# Patient Record
Sex: Female | Born: 1960 | Race: White | Hispanic: No
Health system: Southern US, Community
[De-identification: ages and names within clinical notes are randomized; demographics above are authoritative.]

---

## 1998-07-05 ENCOUNTER — Other Ambulatory Visit: Admission: RE | Admit: 1998-07-05 | Discharge: 1998-07-05 | Payer: Self-pay | Admitting: Obstetrics and Gynecology

## 2005-08-08 ENCOUNTER — Encounter: Payer: Self-pay | Admitting: Emergency Medicine

## 2005-08-08 ENCOUNTER — Inpatient Hospital Stay (HOSPITAL_COMMUNITY): Admission: AD | Admit: 2005-08-08 | Discharge: 2005-08-11 | Payer: Self-pay | Admitting: Internal Medicine

## 2005-08-08 ENCOUNTER — Ambulatory Visit: Payer: Self-pay | Admitting: Pulmonary Disease

## 2005-12-12 ENCOUNTER — Encounter: Admission: RE | Admit: 2005-12-12 | Discharge: 2005-12-12 | Payer: Self-pay | Admitting: Family Medicine

## 2005-12-15 ENCOUNTER — Encounter: Admission: RE | Admit: 2005-12-15 | Discharge: 2005-12-15 | Payer: Self-pay | Admitting: Family Medicine

## 2006-03-05 ENCOUNTER — Other Ambulatory Visit: Admission: RE | Admit: 2006-03-05 | Discharge: 2006-03-05 | Payer: Self-pay | Admitting: Obstetrics and Gynecology

## 2006-07-22 ENCOUNTER — Ambulatory Visit (HOSPITAL_COMMUNITY): Admission: RE | Admit: 2006-07-22 | Discharge: 2006-07-22 | Payer: Self-pay | Admitting: Urology

## 2006-09-29 ENCOUNTER — Other Ambulatory Visit: Payer: Self-pay

## 2006-09-29 ENCOUNTER — Inpatient Hospital Stay: Payer: Self-pay | Admitting: Unknown Physician Specialty

## 2007-06-18 IMAGING — CT CT PELVIS W/ CM
1 of 4 series · 11 of 32 positions shown, 17 images · IV contrast (omnipaque)
Comparison: None

CLINICAL DATA: 45-year-old, chronic UTIs.  
 ABDOMEN CT WITHOUT AND WITH CONTRAST:
TECHNIQUE: Multidetector CT imaging of the abdomen was performed both before and during bolus administration of intravenous contrast.
 Contrast:  125 cc Omnipaque 300
TECHNIQUE: Multidetector CT imaging of the pelvis was performed following the standard protocol during bolus administration of intravenous contrast.

[Series 3: abd_pel 5.0 b40f st · axial · 0.61mm/px · z∈[-400,-40]mm · 11 of 88 slices shown, 17 images]
[im 8/88  soft-tissue]
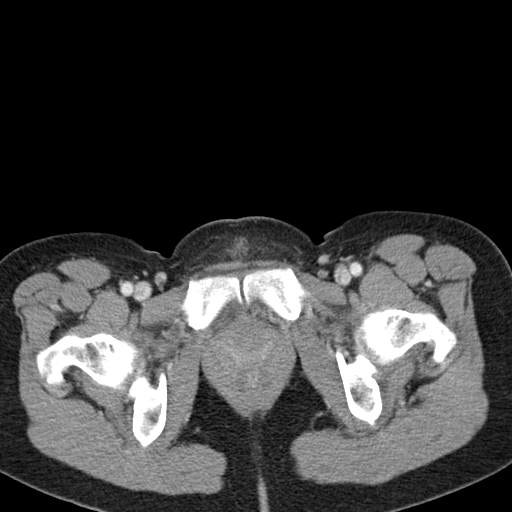
[im 8/88  bone]
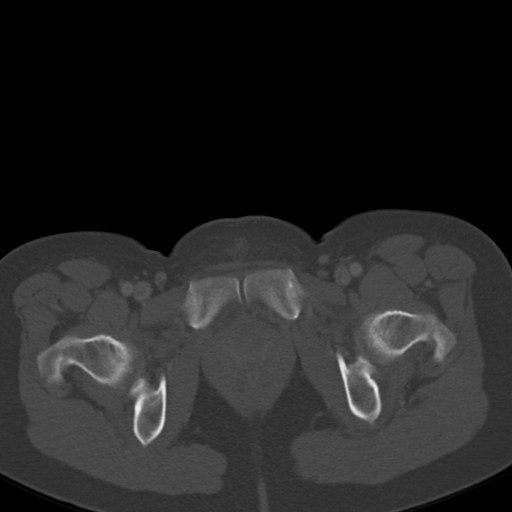
[im 15/88  soft-tissue]
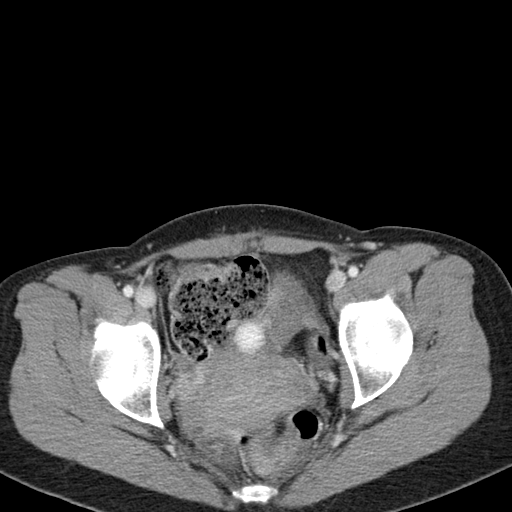
[im 22/88  soft-tissue]
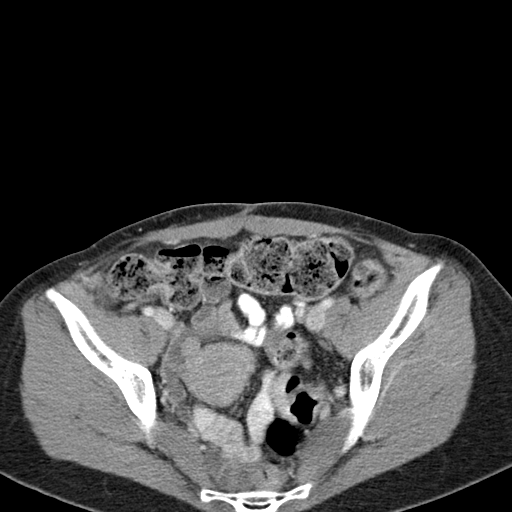
[im 30/88  soft-tissue]
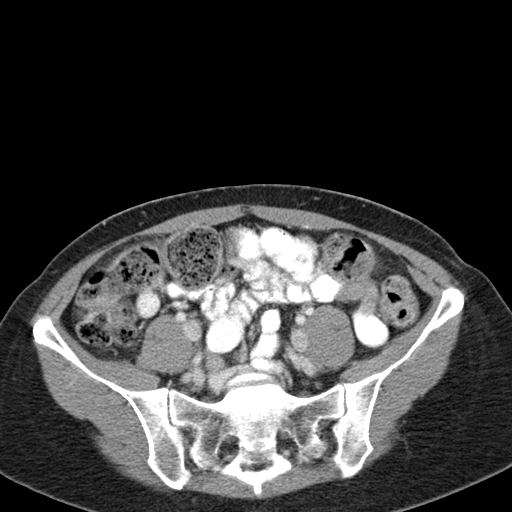
[im 37/88  soft-tissue]
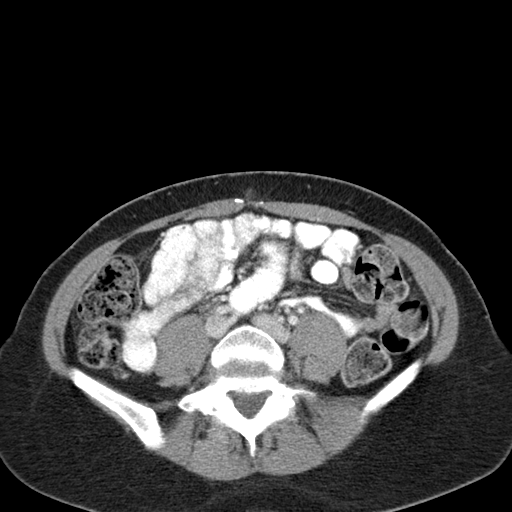
[im 44/88  soft-tissue]
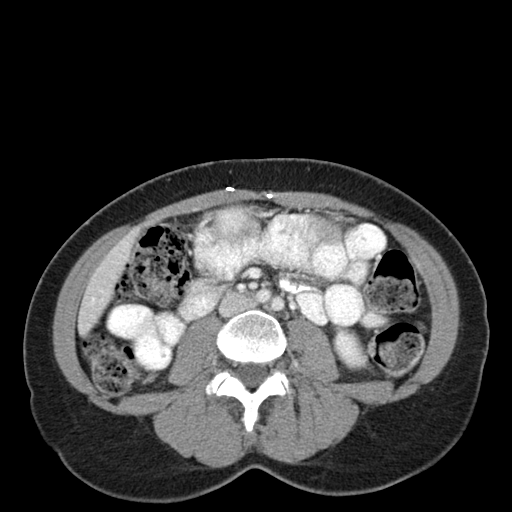
[im 51/88  soft-tissue]
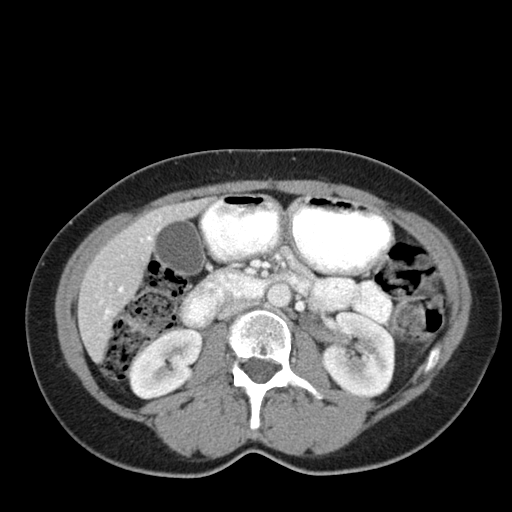
[im 59/88  soft-tissue]
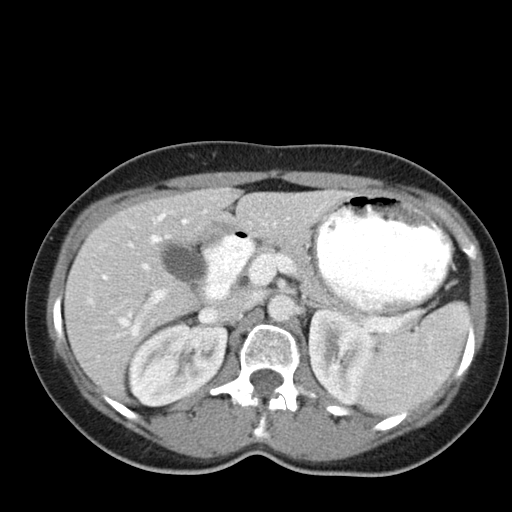
[im 59/88  lung]
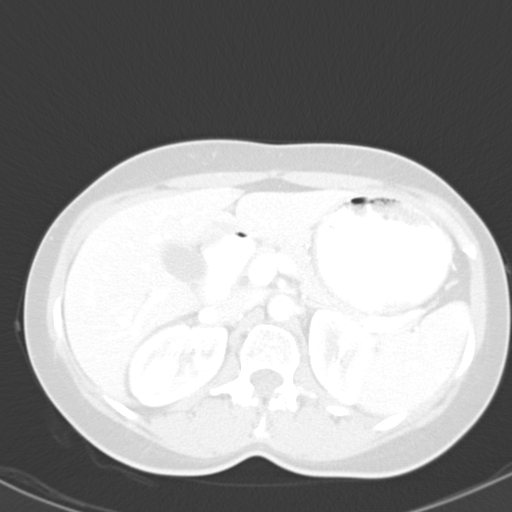
[im 66/88  soft-tissue]
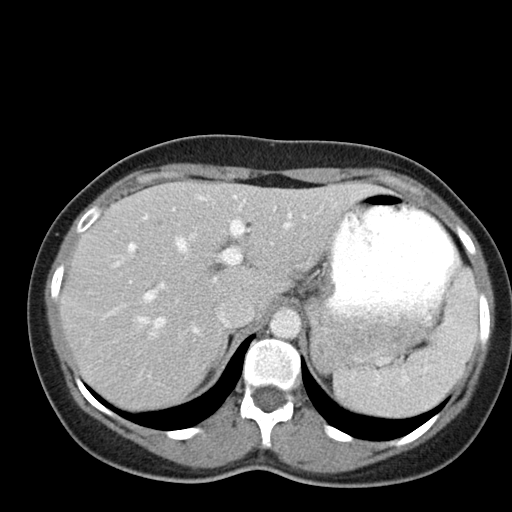
[im 66/88  lung]
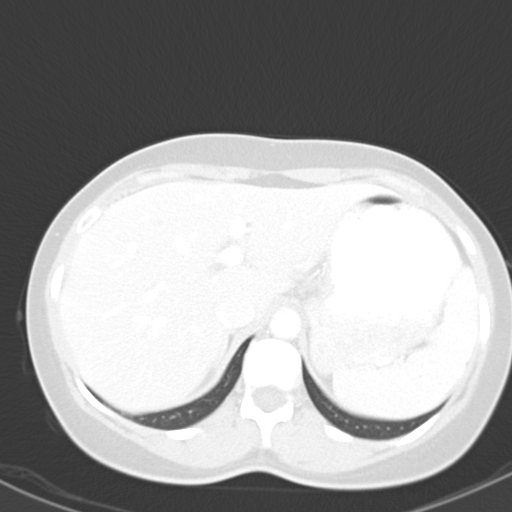
[im 66/88  bone]
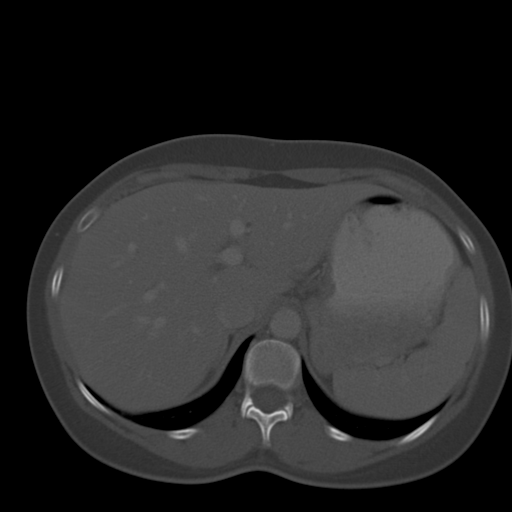
[im 73/88  soft-tissue]
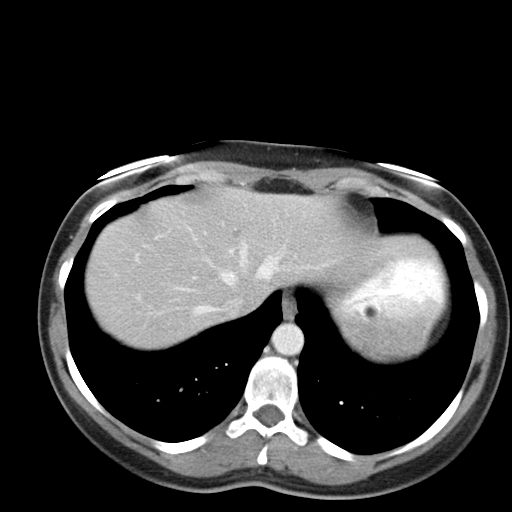
[im 73/88  lung]
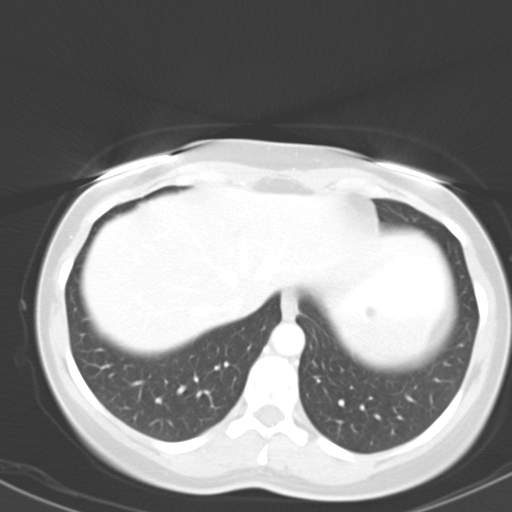
[im 80/88  soft-tissue]
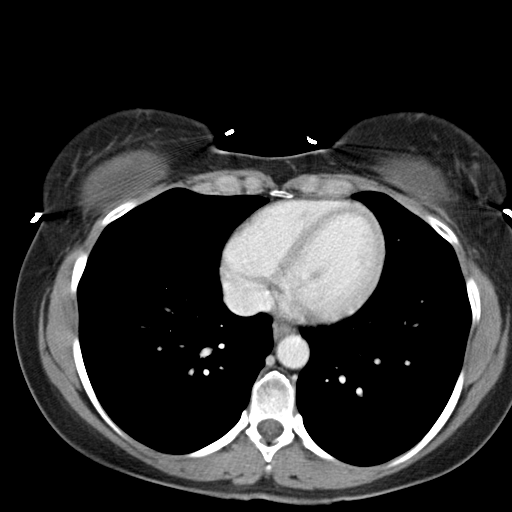
[im 80/88  lung]
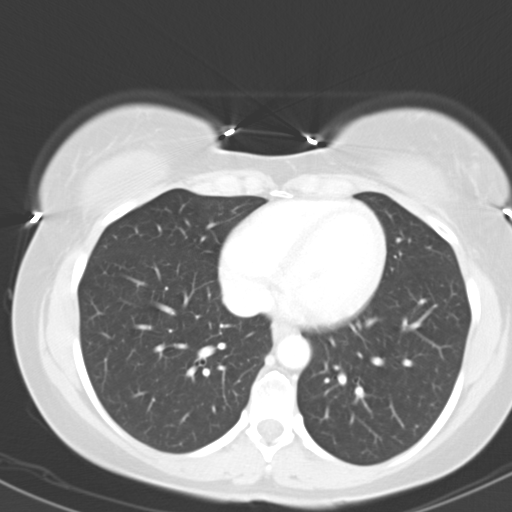

[11 of 32 positions shown; findings below may reference images not displayed]

FINDINGS: The lung bases are clear.  
 There are several low attenuation lesions in the liver which are likely benign hepatic cysts.  The ones that I can measure measure at water attenuation.  There is no enhancement.  There are also bilateral renal cysts.   The spleen is normal in size. The gallbladder appears normal.  The pancreas, adrenal glands, and kidneys demonstrate no significant findings.  The ureters are visualized down into the bladder and demonstrate no abnormalities either.  
 The stomach, duodenum, small bowel and colon are unremarkable.  Mild constipation with a moderate amount of stool throughout the colon.  The aorta is normal in caliber.  Major branch vessels are normal.  The portal and splenic veins are patent.   No significant bony findings.
IMPRESSION: 1.  Multiple small liver lesions consistent with benign hepatic cysts.  There are also benign-appearing bilateral renal cysts.  
 2.  No collecting system abnormalities are seen involving the kidneys.  No renal masses and the ureters appear normal.  
 3.  Mild constipation.
 4.  No mesenteric or retroperitoneal masses or adenopathy. 
 PELVIS CT WITH CONTRAST:
FINDINGS: The uterus and ovaries are unremarkable.  The bladder has a small amount of air in it.  I assume this is from recent instrumentation or catheterization.  Low-lying cecum in the pelvis.  Rectum and sigmoid colon are grossly normal.
IMPRESSION: 1.  Small amount of air is noted in the bladder.  This may be due to recent instrumentation or catheterization.  If there is no history of such, the findings could be secondary to cystitis or a colovesical fistula.  
 2.  Normal appearance of the ureters.

## 2010-03-06 ENCOUNTER — Emergency Department: Payer: Self-pay | Admitting: Emergency Medicine

## 2010-06-09 ENCOUNTER — Encounter: Payer: Self-pay | Admitting: Obstetrics and Gynecology

## 2013-06-06 ENCOUNTER — Telehealth (HOSPITAL_COMMUNITY): Payer: Self-pay | Admitting: *Deleted

## 2013-06-06 NOTE — Telephone Encounter (Signed)
[facility] called to inform Dr. Donell BeersPlovsky that patient is attempting a refill of Adderall 30mg  and if filled this would be 180 tablets since Dec 5th. Would like him to be aware and approve refill before it is filled. Explained to Tristen that the patient is not seen at this office. She shows that she saw Dr. Donell BeersPlovsky at [facility]. She states she will attempt to call that office for clarification.

## 2014-06-02 ENCOUNTER — Emergency Department: Payer: Self-pay | Admitting: Emergency Medicine

## 2015-04-29 IMAGING — CR DG CHEST 2V
1 series · 2 of 2 positions shown · non-contrast
Comparison: None.

CLINICAL DATA: Initial encounter for left chest pain worse with
deep inspiration. Tenderness at the lateral left third rib. Symptoms
are of 1 week duration.

EXAM:
CHEST  2 VIEW

[Series 1: dxr chest pa (or ap) and lateral · 0.14mm/px · 2 of 2 slices shown]
[im 1/2]
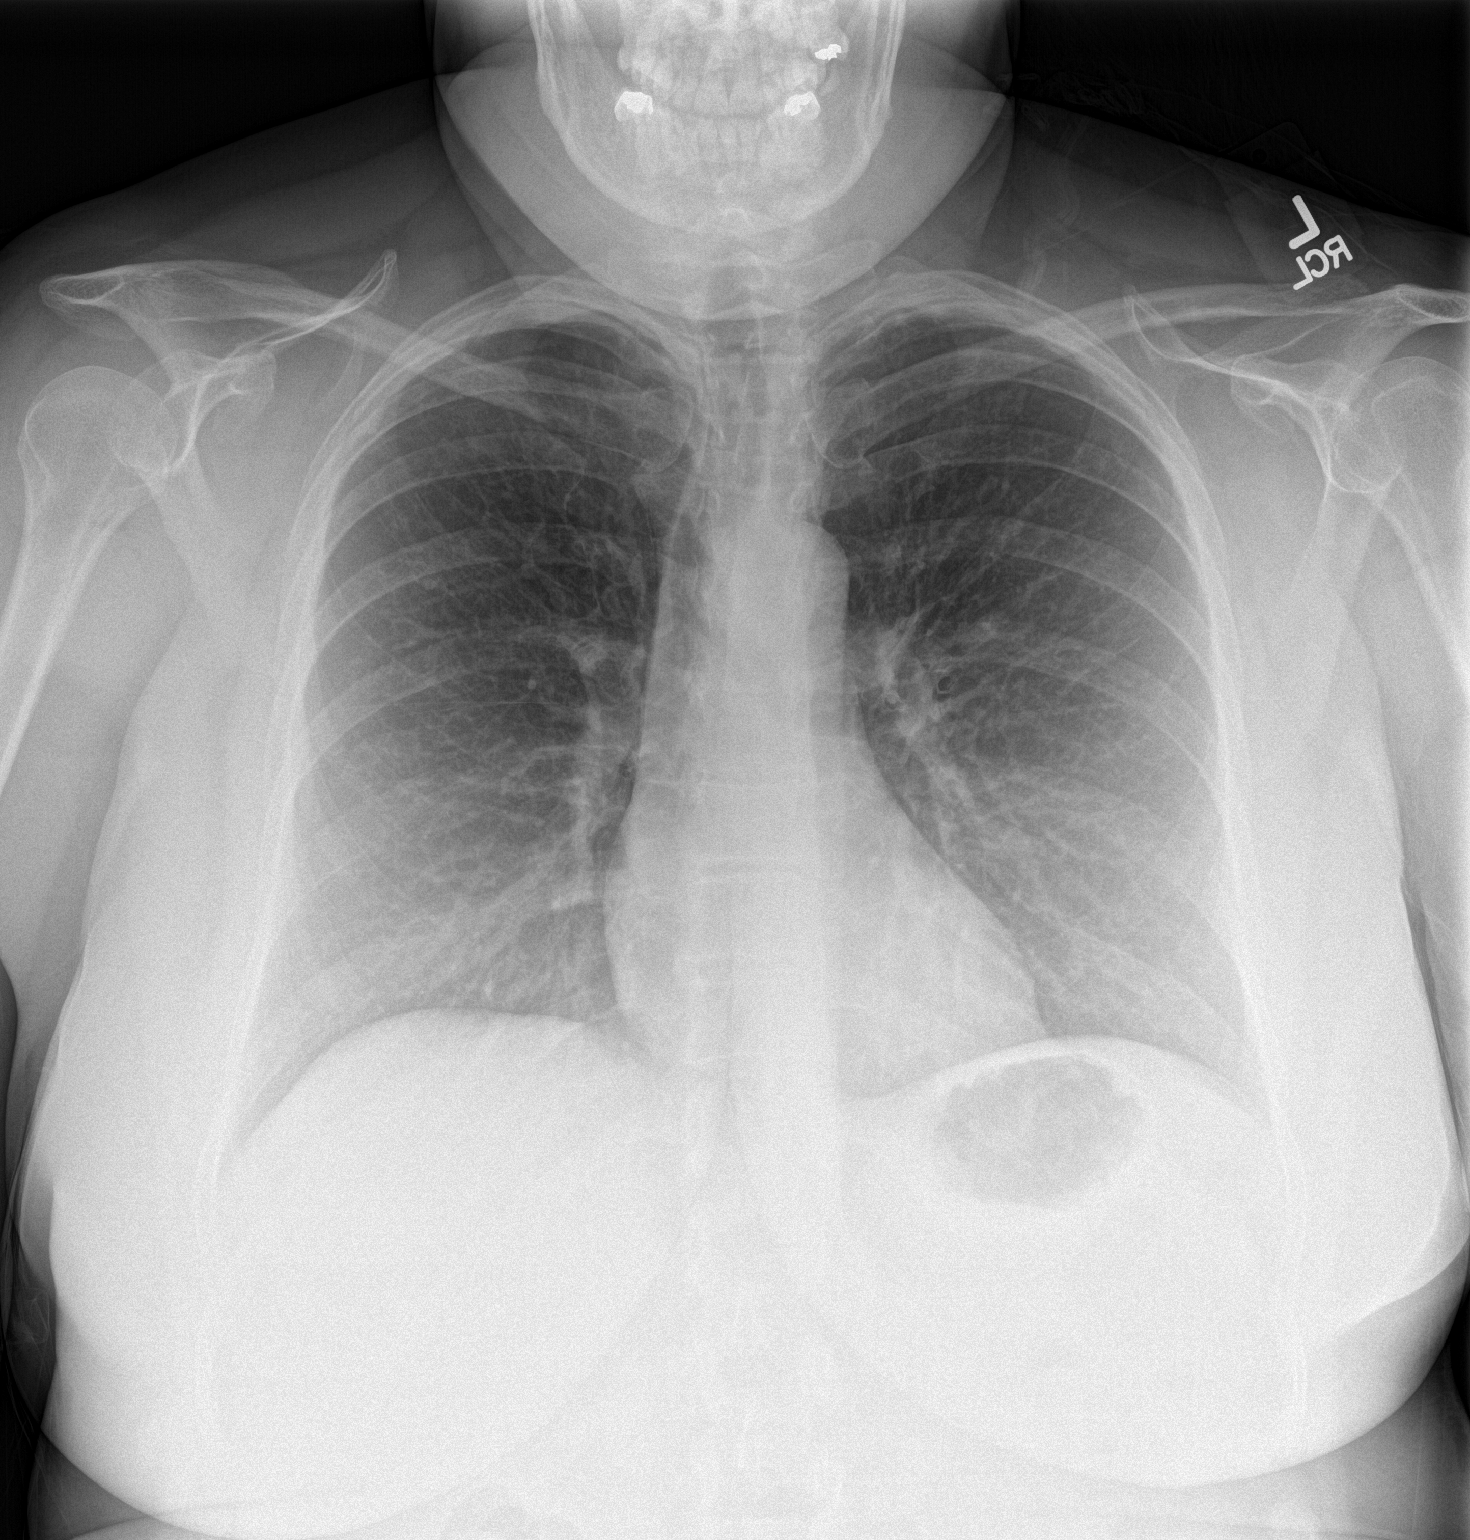
[im 2/2]
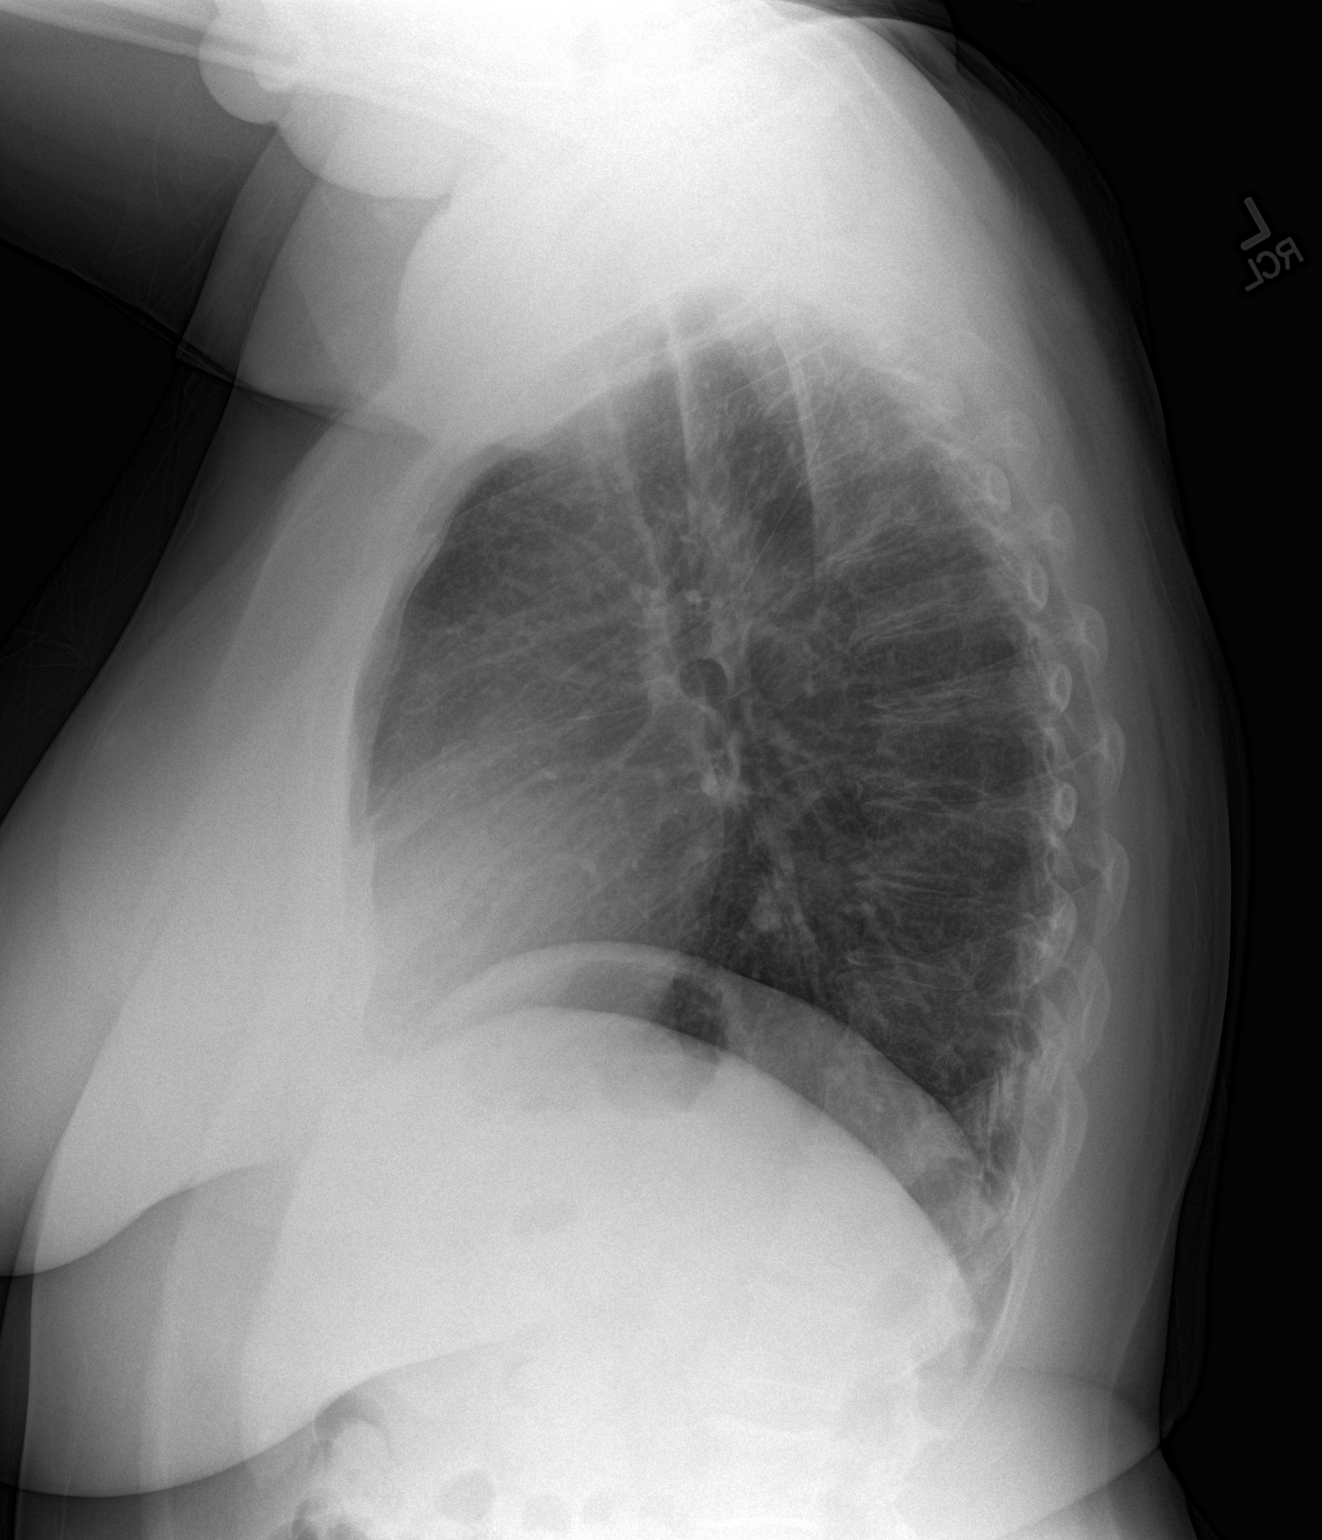

[2 of 2 positions shown; findings below may reference images not displayed]

FINDINGS: The lungs are clear without focal consolidation, edema, effusion or
pneumothorax. Cardio pericardial silhouette is within normal limits
for size. Imaged bony structures of the thorax are intact. No
abnormality is evident along the length of the left third rib.
IMPRESSION: No active cardiopulmonary disease.
